# Patient Record
Sex: Female | Born: 2000 | Race: White | Hispanic: No | Marital: Single | Smoking: Never smoker
Health system: Southern US, Community
[De-identification: ages and names within clinical notes are randomized; demographics above are authoritative.]

## PROBLEM LIST (undated history)

## (undated) DIAGNOSIS — J45909 Unspecified asthma, uncomplicated: Secondary | ICD-10-CM

---
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
## 2018-06-09 ENCOUNTER — Other Ambulatory Visit: Payer: Self-pay | Admitting: Physician Assistant

## 2018-06-09 DIAGNOSIS — H93A9 Pulsatile tinnitus, unspecified ear: Secondary | ICD-10-CM

## 2018-06-22 ENCOUNTER — Ambulatory Visit
Admission: RE | Admit: 2018-06-22 | Discharge: 2018-06-22 | Disposition: A | Discharge status: Home / Self Care | Payer: PRIVATE HEALTH INSURANCE | Source: Ambulatory Visit | Attending: Physician Assistant | Admitting: Physician Assistant

## 2018-06-22 ENCOUNTER — Ambulatory Visit: Payer: PRIVATE HEALTH INSURANCE

## 2018-06-22 DIAGNOSIS — H93A9 Pulsatile tinnitus, unspecified ear: Secondary | ICD-10-CM | POA: Diagnosis not present

## 2018-06-22 HISTORY — DX: Unspecified asthma, uncomplicated: J45.909

## 2018-06-22 MED ORDER — IOPAMIDOL (ISOVUE-370) INJECTION 76%
75.0000 mL | Freq: Once | INTRAVENOUS | Status: AC | PRN
  Administered 2018-06-22: 75 mL via INTRAVENOUS

## 2019-02-16 ENCOUNTER — Telehealth: Payer: Self-pay | Admitting: *Deleted

## 2019-02-16 ENCOUNTER — Other Ambulatory Visit: Payer: 59

## 2019-02-16 ENCOUNTER — Other Ambulatory Visit: Payer: Self-pay | Admitting: Internal Medicine

## 2019-02-16 DIAGNOSIS — Z20822 Contact with and (suspected) exposure to covid-19: Secondary | ICD-10-CM

## 2019-02-16 NOTE — Telephone Encounter (Signed)
Contacted pt; pt offered and accepted appointment at [facility] site 02/16/2019 at 1145; pt given address,location, and instructions that she and all occupants of her vehicle should wear masks; she verbalized understanding; orders placed per protocol.

## 2019-02-16 NOTE — Telephone Encounter (Signed)
Dr Tomasa Hosteller Archinal from [facility], called and would like to have the pt tested for COVID; her symptoms are: sore throat, cold symptoms, and contact with someone who was in contact with someone who clinically tested positive; Dr Oran Rein also states that the pt has asthma; she can be contacted at [phone] Mobile or secure office fax [phone]; pt can be contacted at [phone]; will attempt to contact pt.

## 2019-03-01 LAB — SPECIMEN STATUS REPORT

## 2019-03-01 LAB — NOVEL CORONAVIRUS, NAA: SARS-CoV-2, NAA: NOT DETECTED

## 2019-03-15 IMAGING — CT CT ANGIO NECK
1 of 12 series · 4 of 33 positions shown · IV contrast (iopamidol)
Comparison: None.

CLINICAL DATA: Pulsatile noise. Symptoms began last [REDACTED] after
episode of ear infection.

EXAM:
CT ANGIOGRAPHY HEAD AND NECK
TECHNIQUE: Multidetector CT imaging of the head and neck was performed using
the standard protocol during bolus administration of intravenous
contrast. Multiplanar CT image reconstructions and MIPs were
obtained to evaluate the vascular anatomy. Carotid stenosis
measurements (when applicable) are obtained utilizing NASCET
criteria, using the distal internal carotid diameter as the
denominator.
CONTRAST:  75mL 7XFWRA-2EC IOPAMIDOL (7XFWRA-2EC) INJECTION 76%

[Series 16: ax thin mips cta head & neck · axial · 0.56mm/px · z∈[-748,-548]mm · 4 of 334 slices shown]
[im 67/334  soft-tissue]
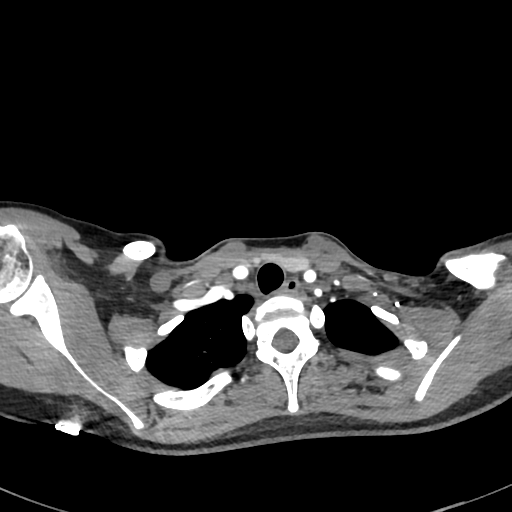
[im 134/334  bone]
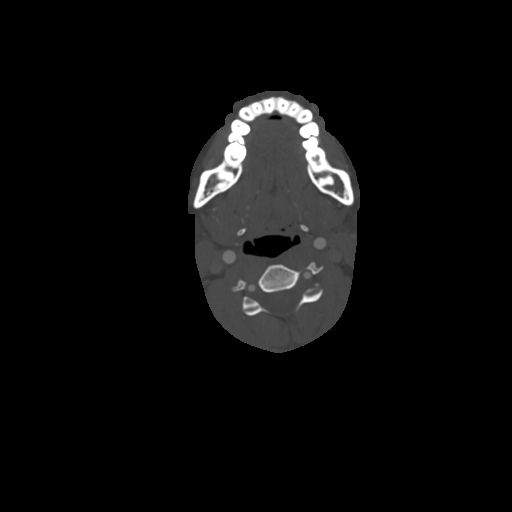
[im 200/334  soft-tissue]
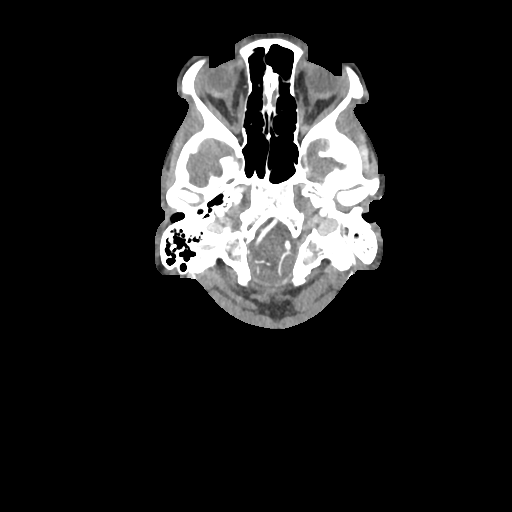
[im 267/334  bone]
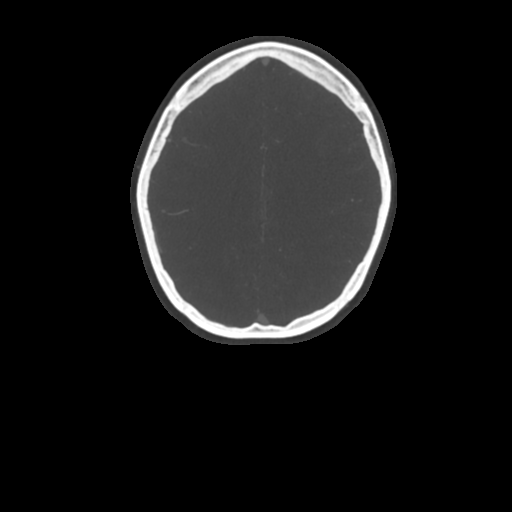

[4 of 33 positions shown; findings below may reference images not displayed]

FINDINGS: CT HEAD FINDINGS

Brain: The brain shows a normal appearance without evidence of
malformation, atrophy, old or acute small or large vessel
infarction, mass lesion, hemorrhage, hydrocephalus or extra-axial
collection.

Vascular: No hyperdense vessel. No evidence of atherosclerotic
calcification.

Skull: Normal.  No traumatic finding.  No focal bone lesion.

Sinuses/Orbits: Sinuses are clear. Orbits appear normal. Mastoids
are clear.

Other: None significant

CTA NECK FINDINGS

Aortic arch: Normal

Right carotid system: Normal except for mild tortuosity and looping
of the upper cervical ICA just beneath the skull base.

Left carotid system: Normal except for mild tortuosity of the upper
cervical ICA just beneath the skull base.

Vertebral arteries: Normal

Skeleton: Normal. Incidental and insignificant ossicle associated
with the transverse ligament of C1-2 on the right.

Other neck: No abnormal finding.

Upper chest: Normal

Review of the MIP images confirms the above findings

CTA HEAD FINDINGS

Anterior circulation: Both internal carotid arteries are widely
patent through the skull base and siphon regions. The anterior and
middle cerebral vessels are normal without proximal stenosis,
aneurysm or vascular malformation.

Posterior circulation: Both vertebral arteries are widely patent
through the foramen magnum to the basilar. No basilar stenosis.
Posterior circulation branch vessels are normal. No abnormal vessels
seen in the CP angle regions.

Venous sinuses: Normal venous anatomy. No evidence of venous
thrombosis.

Anatomic variants: None significant.

Delayed phase: No abnormal enhancement.

Review of the MIP images confirms the above findings
IMPRESSION: The examination is normal except for tortuosity of the upper
cervical internal carotid arteries, more notable on the right than
the left. Usually, this is an incidental and insignificant finding.
There are some reports of this being associated with tinnitus, but
this is debatable.

## 2019-06-03 ENCOUNTER — Other Ambulatory Visit: Payer: Self-pay | Admitting: *Deleted

## 2019-06-03 DIAGNOSIS — Z20822 Contact with and (suspected) exposure to covid-19: Secondary | ICD-10-CM

## 2019-06-04 LAB — NOVEL CORONAVIRUS, NAA: SARS-CoV-2, NAA: NOT DETECTED
